# Patient Record
Sex: Male | Born: 1965 | Race: Black or African American | Hispanic: No | Marital: Married | Smoking: Never smoker
Health system: Southern US, Community
[De-identification: ages and names within clinical notes are randomized; demographics above are authoritative.]

## PROBLEM LIST (undated history)

## (undated) HISTORY — PX: HAND SURGERY: SHX662

---

## 1999-10-24 ENCOUNTER — Emergency Department (HOSPITAL_COMMUNITY): Admission: EM | Admit: 1999-10-24 | Discharge: 1999-10-24 | Payer: Self-pay | Admitting: Emergency Medicine

## 1999-10-24 ENCOUNTER — Encounter: Payer: Self-pay | Admitting: Emergency Medicine

## 1999-11-23 ENCOUNTER — Emergency Department (HOSPITAL_COMMUNITY): Admission: EM | Admit: 1999-11-23 | Discharge: 1999-11-23 | Payer: Self-pay | Admitting: Emergency Medicine

## 1999-11-23 ENCOUNTER — Encounter: Payer: Self-pay | Admitting: Emergency Medicine

## 2000-04-01 ENCOUNTER — Emergency Department (HOSPITAL_COMMUNITY): Admission: EM | Admit: 2000-04-01 | Discharge: 2000-04-01 | Payer: Self-pay | Admitting: Emergency Medicine

## 2000-04-01 ENCOUNTER — Encounter: Payer: Self-pay | Admitting: Emergency Medicine

## 2000-04-12 ENCOUNTER — Emergency Department (HOSPITAL_COMMUNITY): Admission: EM | Admit: 2000-04-12 | Discharge: 2000-04-12 | Payer: Self-pay | Admitting: Emergency Medicine

## 2000-04-12 ENCOUNTER — Encounter: Payer: Self-pay | Admitting: Emergency Medicine

## 2000-04-20 ENCOUNTER — Ambulatory Visit (HOSPITAL_BASED_OUTPATIENT_CLINIC_OR_DEPARTMENT_OTHER): Admission: RE | Admit: 2000-04-20 | Discharge: 2000-04-20 | Payer: Self-pay | Admitting: Orthopedic Surgery

## 2000-10-27 ENCOUNTER — Emergency Department (HOSPITAL_COMMUNITY): Admission: EM | Admit: 2000-10-27 | Discharge: 2000-10-27 | Payer: Self-pay | Admitting: Emergency Medicine

## 2000-10-27 ENCOUNTER — Encounter: Payer: Self-pay | Admitting: Emergency Medicine

## 2001-03-23 ENCOUNTER — Emergency Department (HOSPITAL_COMMUNITY): Admission: EM | Admit: 2001-03-23 | Discharge: 2001-03-23 | Payer: Self-pay | Admitting: *Deleted

## 2002-06-18 ENCOUNTER — Encounter: Payer: Self-pay | Admitting: Emergency Medicine

## 2002-06-18 ENCOUNTER — Emergency Department (HOSPITAL_COMMUNITY): Admission: EM | Admit: 2002-06-18 | Discharge: 2002-06-18 | Payer: Self-pay | Admitting: Emergency Medicine

## 2003-03-31 ENCOUNTER — Emergency Department (HOSPITAL_COMMUNITY): Admission: AD | Admit: 2003-03-31 | Discharge: 2003-03-31 | Payer: Self-pay | Admitting: Family Medicine

## 2004-12-05 IMAGING — CR DG KNEE COMPLETE 4+V*L*
4 series · 4 of 4 positions shown · non-contrast
Comparison: none

CLINICAL DATA: LEFT KNEE PAIN. 
 LEFT KNEE, FOUR VIEWS
 No evidence of fracture, dislocation, or detectable effusion. The lateral view is not exactly lateral.
 IMPRESSION
 Negative plain radiographs.

[view not recorded (1 of 4)]
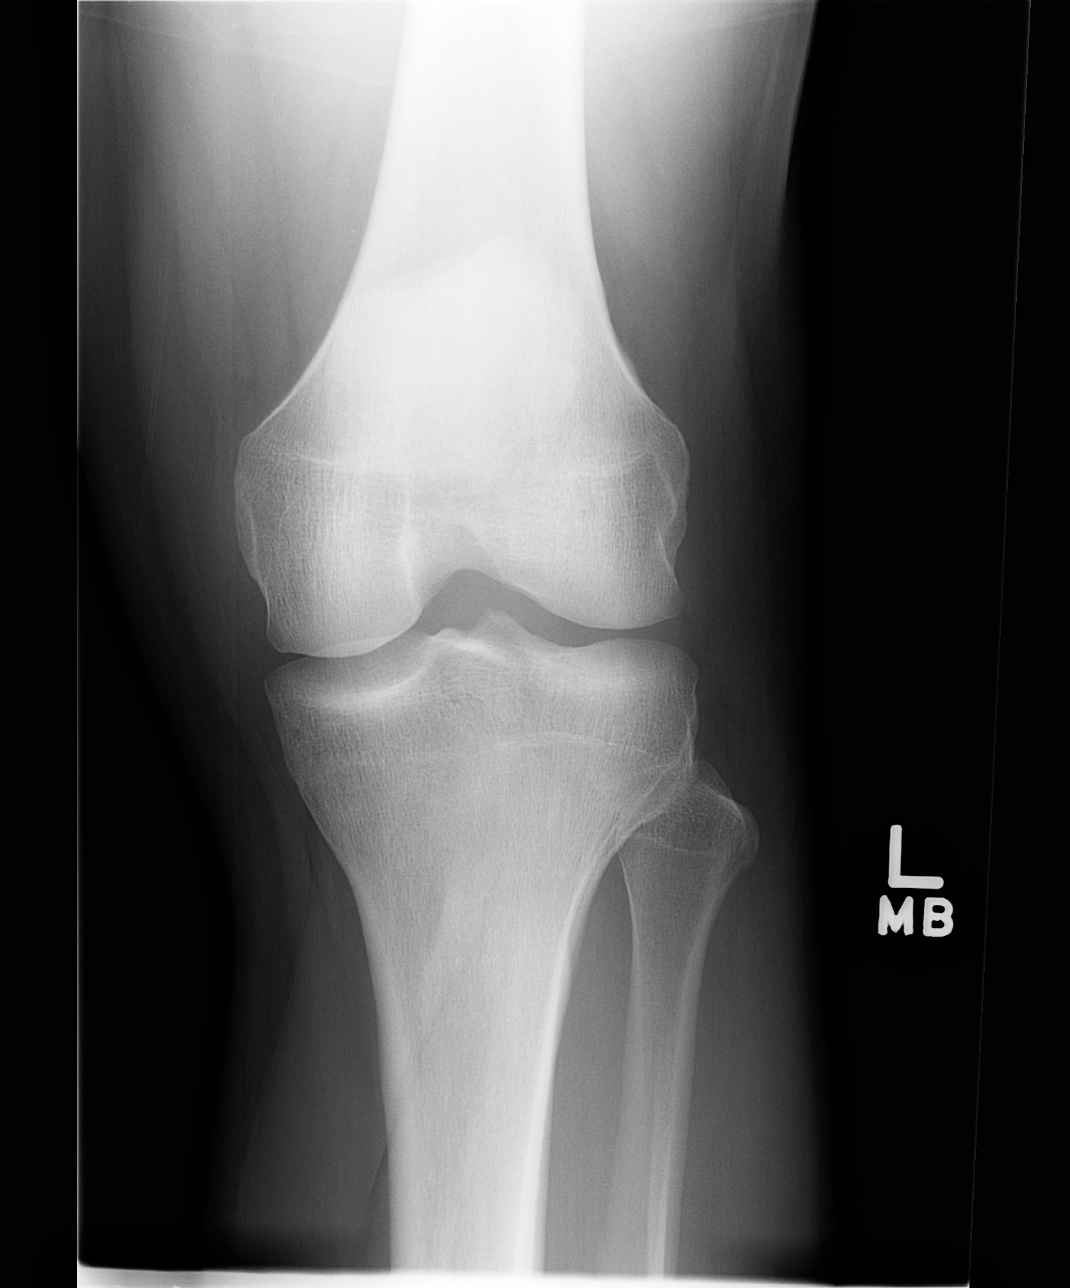

[view not recorded (2 of 4)]
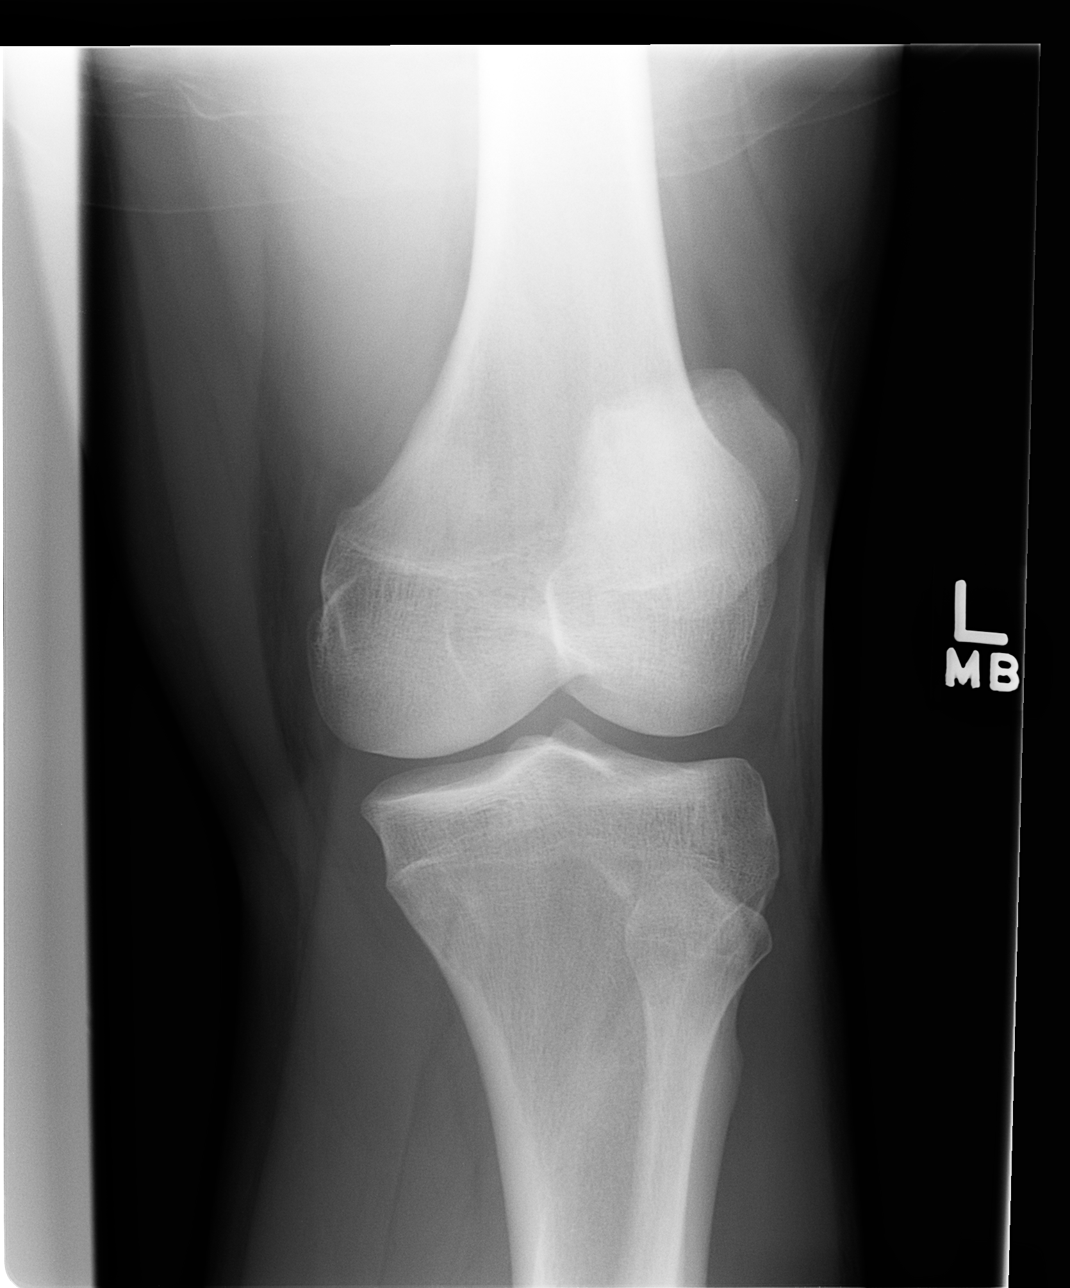

[view not recorded (3 of 4)]
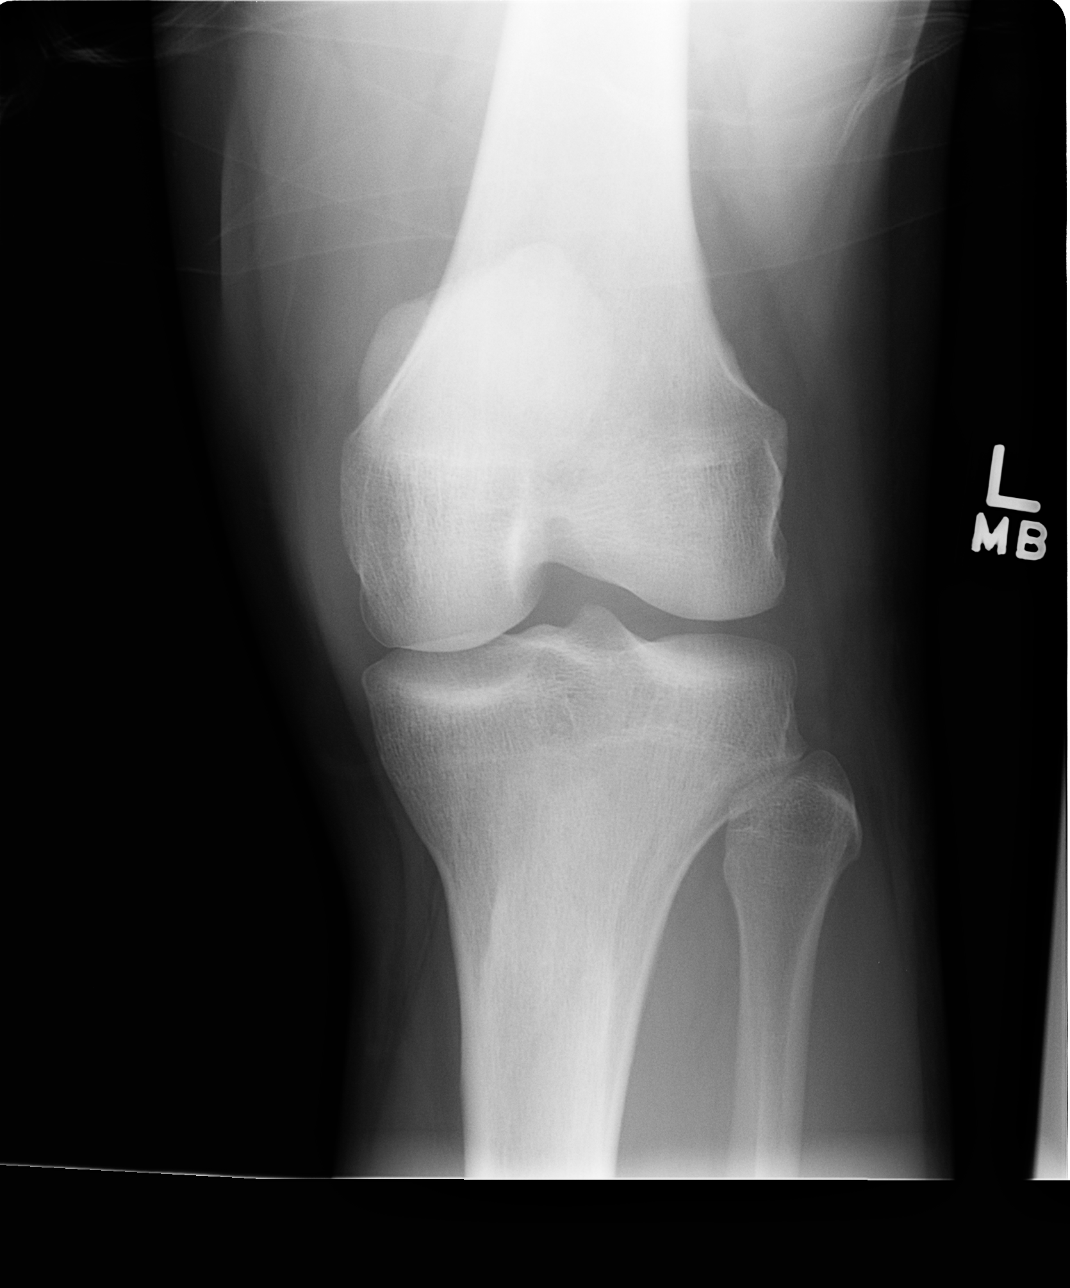

[view not recorded (4 of 4)]
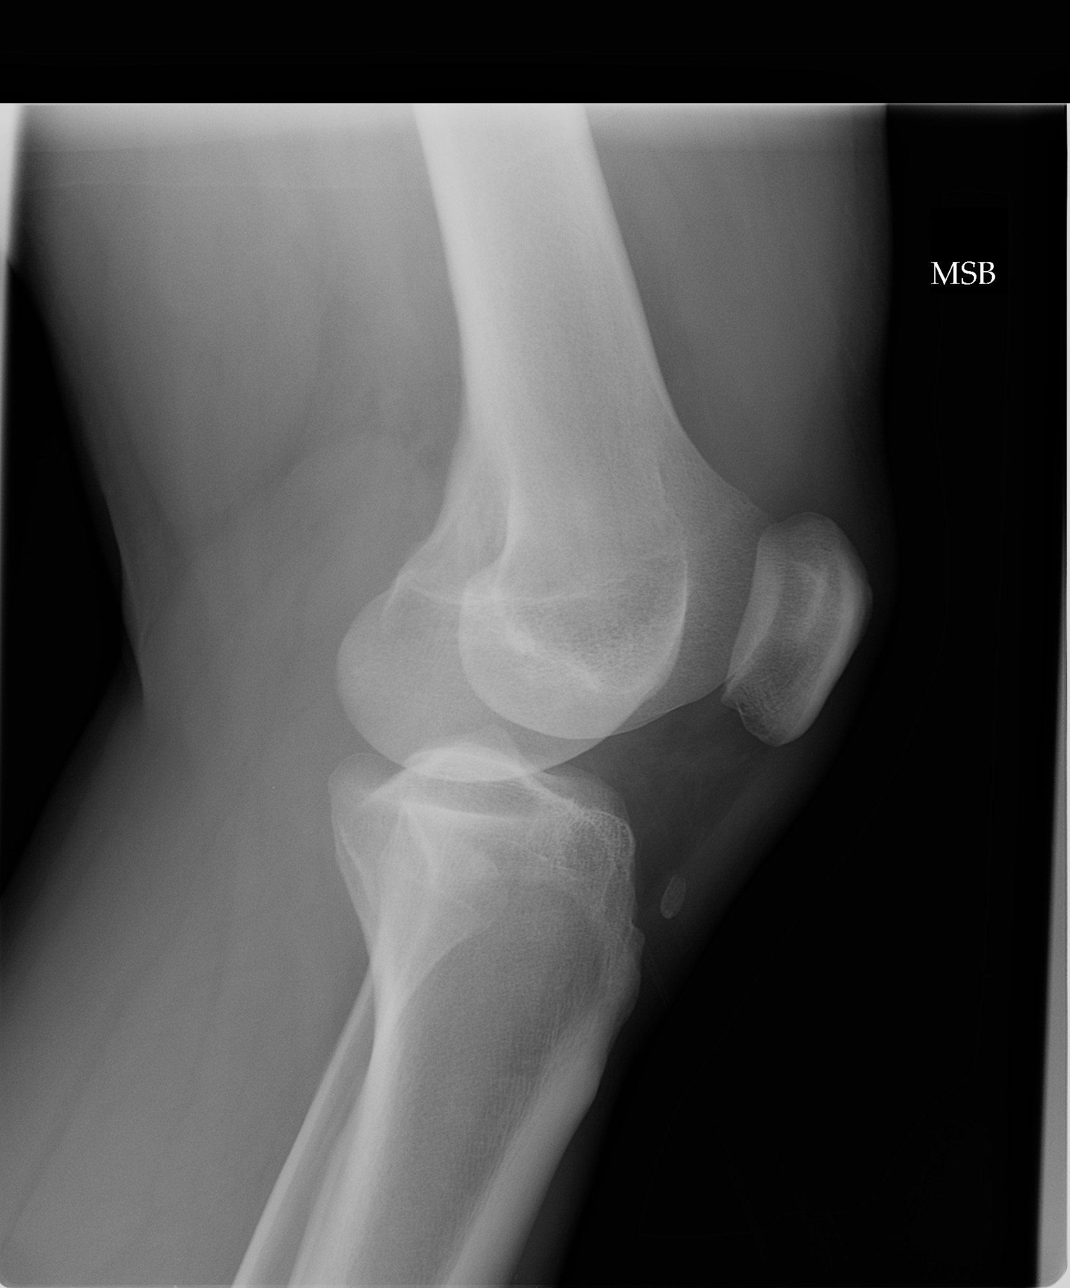

[4 of 4 positions shown; findings below may reference images not displayed]

## 2011-03-23 ENCOUNTER — Encounter: Payer: Self-pay | Admitting: *Deleted

## 2011-03-23 ENCOUNTER — Emergency Department (INDEPENDENT_AMBULATORY_CARE_PROVIDER_SITE_OTHER)
Admission: EM | Admit: 2011-03-23 | Discharge: 2011-03-23 | Disposition: A | Discharge status: Home / Self Care | Payer: Self-pay | Source: Home / Self Care | Attending: Family Medicine | Admitting: Family Medicine

## 2011-03-23 DIAGNOSIS — Z202 Contact with and (suspected) exposure to infections with a predominantly sexual mode of transmission: Secondary | ICD-10-CM

## 2011-03-23 MED ORDER — METRONIDAZOLE 250 MG PO TABS: 250.0000 mg | ORAL_TABLET | Freq: Three times a day (TID) | ORAL | Status: AC

## 2011-03-23 NOTE — ED Provider Notes (Addendum)
History     CSN: [id number] Arrival date & time: 03/23/2011  8:33 AM   First MD Initiated Contact with Patient 03/23/11 [phone]      Chief Complaint  Patient presents with  . Exposure to STD    (Consider location/radiation/quality/duration/timing/severity/associated sxs/prior treatment) Patient is a 45 y.o. male presenting with STD exposure. The history is provided by the patient.  Exposure to STD This is a new problem. The current episode started more than 2 days ago (told by partner she has trichomonas, pt with no sx and no other partner.). The problem has not changed since onset.   History reviewed. No pertinent past medical history.  Past Surgical History  Procedure Date  . Hand surgery     Family History  Problem Relation Age of Onset  . Hypertension Father     History  Substance Use Topics  . Smoking status: Never Smoker   . Smokeless tobacco: Not on file  . Alcohol Use: Yes     social      Review of Systems  Constitutional: Negative.   Gastrointestinal: Negative.   Genitourinary: Negative.     Allergies  Review of patient's allergies indicates no known allergies.  Home Medications  No current outpatient prescriptions on file.  BP 136/81  Pulse 72  Temp(Src) 98.2 F (36.8 C) (Oral)  Resp 12  SpO2 100%  Physical Exam  Nursing note and vitals reviewed. Constitutional: He appears well-developed and well-nourished.  Abdominal: Soft. Bowel sounds are normal.  Genitourinary: Testes normal and penis normal. Circumcised.    ED Course  Procedures (including critical care time)  Labs Reviewed - No data to display No results found.   1. Exposure to STD       MDM          Barkley Bruns, MD 03/23/11 5621  Linna Hoff, MD 09/11/11 [phone]

## 2011-03-23 NOTE — ED Notes (Signed)
Pt states sexual partner told him she has Trichomonas.  Pt states he is not having any sx.

## 2012-11-27 ENCOUNTER — Encounter (HOSPITAL_COMMUNITY): Payer: Self-pay | Admitting: Emergency Medicine

## 2012-11-27 ENCOUNTER — Emergency Department (INDEPENDENT_AMBULATORY_CARE_PROVIDER_SITE_OTHER)
Admission: EM | Admit: 2012-11-27 | Discharge: 2012-11-27 | Disposition: A | Discharge status: Home / Self Care | Payer: Self-pay | Source: Home / Self Care

## 2012-11-27 DIAGNOSIS — M545 Low back pain, unspecified: Secondary | ICD-10-CM

## 2012-11-27 MED ORDER — MELOXICAM 15 MG PO TABS: 15.0000 mg | ORAL_TABLET | Freq: Every day | ORAL | Status: AC

## 2012-11-27 MED ORDER — TRAMADOL HCL 50 MG PO TABS: 50.0000 mg | ORAL_TABLET | Freq: Four times a day (QID) | ORAL | Status: AC | PRN

## 2012-11-27 MED ORDER — CYCLOBENZAPRINE HCL 10 MG PO TABS: 10.0000 mg | ORAL_TABLET | Freq: Two times a day (BID) | ORAL | Status: AC | PRN
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
## 2019-11-23 ENCOUNTER — Ambulatory Visit: Payer: Self-pay | Admitting: Family Medicine

## 2019-12-14 ENCOUNTER — Telehealth: Payer: Self-pay | Admitting: Family Medicine

## 2019-12-14 ENCOUNTER — Encounter: Payer: Self-pay | Admitting: Family Medicine

## 2019-12-14 NOTE — Telephone Encounter (Signed)
Pt was no show for appt 11/23/19 for new pt appt. First occurrence. Fee waived. Letter mailed.  PCP,  Please reply back with corresponding letter matching appropriate follow up needs.  A - No follow up necessary B - Follow up urgent - locate patient immediately to schedule appointment. C - Follow up necessary. Contact patient and schedule visit w/in 7 days. D - Follow up necessary. Contact patient and schedule visit w/in 2-4 weeks.  E - Follow up necessary. Contact patient and schedule visit w/in 3 months.
